# Patient Record
Sex: Female | Born: 1935 | Race: Black or African American | Hispanic: No | Marital: Single | Smoking: Never smoker
Health system: Southern US, Community
[De-identification: ages and names within clinical notes are randomized; demographics above are authoritative.]

## PROBLEM LIST (undated history)

## (undated) DIAGNOSIS — I10 Essential (primary) hypertension: Secondary | ICD-10-CM

## (undated) HISTORY — PX: SMALL INTESTINE SURGERY: SHX150

---

## 2018-09-10 ENCOUNTER — Encounter (HOSPITAL_COMMUNITY): Payer: Self-pay

## 2018-09-10 ENCOUNTER — Emergency Department (HOSPITAL_COMMUNITY)
Admission: EM | Admit: 2018-09-10 | Discharge: 2018-09-10 | Disposition: A | Discharge status: Home / Self Care | Payer: Medicare Other | Attending: Emergency Medicine | Admitting: Emergency Medicine

## 2018-09-10 ENCOUNTER — Other Ambulatory Visit: Payer: Self-pay

## 2018-09-10 ENCOUNTER — Emergency Department (HOSPITAL_COMMUNITY): Payer: Medicare Other

## 2018-09-10 DIAGNOSIS — Z79899 Other long term (current) drug therapy: Secondary | ICD-10-CM | POA: Insufficient documentation

## 2018-09-10 DIAGNOSIS — R110 Nausea: Secondary | ICD-10-CM | POA: Diagnosis not present

## 2018-09-10 DIAGNOSIS — R1013 Epigastric pain: Secondary | ICD-10-CM | POA: Diagnosis not present

## 2018-09-10 LAB — URINALYSIS, ROUTINE W REFLEX MICROSCOPIC
BILIRUBIN URINE: NEGATIVE
Bacteria, UA: NONE SEEN
Glucose, UA: NEGATIVE mg/dL
HGB URINE DIPSTICK: NEGATIVE
Ketones, ur: 5 mg/dL — AB
Nitrite: NEGATIVE
Protein, ur: NEGATIVE mg/dL
pH: 8 (ref 5.0–8.0)

## 2018-09-10 LAB — COMPREHENSIVE METABOLIC PANEL
ALT: 15 U/L (ref 0–44)
ANION GAP: 10 (ref 5–15)
AST: 26 U/L (ref 15–41)
Albumin: 4.1 g/dL (ref 3.5–5.0)
Alkaline Phosphatase: 62 U/L (ref 38–126)
BUN: 22 mg/dL (ref 8–23)
CO2: 29 mmol/L (ref 22–32)
Calcium: 9.4 mg/dL (ref 8.9–10.3)
Chloride: 101 mmol/L (ref 98–111)
Creatinine, Ser: 1.34 mg/dL — ABNORMAL HIGH (ref 0.44–1.00)
GFR calc Af Amer: 39 mL/min — ABNORMAL LOW (ref >60)
GFR calc non Af Amer: 34 mL/min — ABNORMAL LOW (ref >60)
Glucose, Bld: 95 mg/dL (ref 70–99)
POTASSIUM: 4 mmol/L (ref 3.5–5.1)
Sodium: 140 mmol/L (ref 135–145)
Total Bilirubin: 0.5 mg/dL (ref 0.3–1.2)
Total Protein: 7.7 g/dL (ref 6.5–8.1)

## 2018-09-10 LAB — CBC
HCT: 35.6 % — ABNORMAL LOW (ref 36.0–46.0)
Hemoglobin: 11.5 g/dL — ABNORMAL LOW (ref 12.0–15.0)
MCH: 28.5 pg (ref 26.0–34.0)
MCHC: 32.3 g/dL (ref 30.0–36.0)
MCV: 88.3 fL (ref 80.0–100.0)
Platelets: 260 10*3/uL (ref 150–400)
RBC: 4.03 MIL/uL (ref 3.87–5.11)
RDW: 12.6 % (ref 11.5–15.5)
WBC: 8.8 10*3/uL (ref 4.0–10.5)
nRBC: 0 % (ref 0.0–0.2)

## 2018-09-10 LAB — LIPASE, BLOOD: Lipase: 72 U/L — ABNORMAL HIGH (ref 11–51)

## 2018-09-10 LAB — LACTIC ACID, PLASMA: LACTIC ACID, VENOUS: 1.3 mmol/L (ref 0.5–1.9)

## 2018-09-10 MED ORDER — MORPHINE SULFATE (PF) 4 MG/ML IV SOLN
4.0000 mg | Freq: Once | INTRAVENOUS | Status: AC
  Administered 2018-09-10: 4 mg via INTRAVENOUS
  Filled 2018-09-10: qty 1

## 2018-09-10 MED ORDER — PANTOPRAZOLE SODIUM 40 MG PO TBEC: 40.0000 mg | DELAYED_RELEASE_TABLET | Freq: Every day | ORAL | 0 refills | Status: AC

## 2018-09-10 MED ORDER — IOHEXOL 300 MG/ML  SOLN
100.0000 mL | Freq: Once | INTRAMUSCULAR | Status: AC | PRN
  Administered 2018-09-10: 80 mL via INTRAVENOUS

## 2018-09-10 MED ORDER — HYDROCODONE-ACETAMINOPHEN 5-325 MG PO TABS: 1.0000 | ORAL_TABLET | ORAL | 0 refills | Status: AC | PRN

## 2018-09-10 MED ORDER — PANTOPRAZOLE SODIUM 40 MG IV SOLR
40.0000 mg | Freq: Once | INTRAVENOUS | Status: AC
  Administered 2018-09-10: 40 mg via INTRAVENOUS
  Filled 2018-09-10: qty 40

## 2018-09-10 MED ORDER — SODIUM CHLORIDE 0.9 % IV BOLUS
1000.0000 mL | Freq: Once | INTRAVENOUS | Status: AC
  Administered 2018-09-10: 1000 mL via INTRAVENOUS

## 2018-09-10 NOTE — Discharge Instructions (Signed)
Continue Pepcid and start Protonix for abdominal pain Take Tylenol for mild-moderate pain. Take Norco for severe abdominal pain Please follow up with GI Return if worsening

## 2018-09-10 NOTE — ED Provider Notes (Signed)
[facility] Provider Note   CSN: [id number] Arrival date & time: 09/10/18  1029     History   Chief Complaint Chief Complaint  Patient presents with  . Abdominal Pain    HPI Dominique Burke is a 85 y.o. female who presents with abdominal pain. PMH significant for osteopenia, gastritis, hypertension, hyperlipidemia.  [place] and is visiting her daughter for the next 3 weeks.  She has been having intermittent epigastric abdominal pain since Thanksgiving.  The pain is worse with eating.  Nothing has made the pain better.  She has been seen in the emergency department several times and was diagnosed with gastritis.  It sounds like she had a CT of her abdomen and right upper quadrant ultrasound which were normal.  She was started on Pepcid which she has been taking without significant relief.  The pain was worse today and she has had a couple episodes of vomiting therefore her daughter wanted to bring her to the emergency department here.  The vomiting is food contents.  She had a bowel movement this morning and is passing gas.  She denies fever, chills, chest pain, shortness of breath, urinary symptoms, blood in the stool.  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  She has never had an endoscopy that she knows of.  She has not seen GI for this problem.  Her daughter is concerned because she is lost weight and thinks she may be dehydrated.  HPI  History reviewed. No pertinent past medical history.  There are no active problems to display for this patient.   History reviewed. No pertinent surgical history.   OB History   No obstetric history on file.      Home Medications    Prior to Admission medications   Medication Sig Start Date End Date Taking? Authorizing  Provider  alendronate (FOSAMAX) 35 MG tablet Take 35 mg by mouth every Saturday. Take with a full glass of water on an empty stomach.   Yes [provider]  dorzolamide (TRUSOPT) 2 % ophthalmic solution Place 1 drop into both eyes 3 (three) times daily.   Yes [provider]  famotidine (PEPCID) 20 MG tablet Take 20 mg by mouth daily.   Yes [provider]  irbesartan-hydrochlorothiazide (AVALIDE) 300-12.5 MG tablet Take 1 tablet by mouth daily.   Yes [provider]  potassium chloride SA (K-DUR,KLOR-CON) 20 MEQ tablet Take 20 mEq by mouth daily.   Yes [provider]  rosuvastatin (CRESTOR) 10 MG tablet Take 10 mg by mouth daily.   Yes [provider]    Family History History reviewed. No pertinent family history.  Social History Social History   Tobacco Use  . Smoking status: Never Smoker  Substance Use Topics  . Alcohol use: Never    Frequency: Never  . Drug use: Never     Allergies   Patient has no known allergies.   Review of Systems Review of Systems  Constitutional: Positive for appetite change and unexpected weight change. Negative for chills and fever.  Respiratory: Negative for shortness of breath.   Cardiovascular: Negative for chest pain.  Gastrointestinal: Positive for abdominal pain, nausea and vomiting. Negative for blood in stool, constipation and diarrhea.  Genitourinary: Negative for difficulty urinating and dysuria.  All other systems  reviewed and are negative.    Physical Exam Updated Vital Signs BP (!) 141/60 (BP Location: Left Arm)   Pulse (!) 103   Temp 97.6 F (36.4 C) (Oral)   Ht 5\' 1"  (1.549 m)   Wt 68 kg   SpO2 95%   BMI 28.34 kg/m   Physical Exam Vitals signs and nursing note reviewed.  Constitutional:      General: She is not in acute distress.    Appearance: She is well-developed.  HENT:     Head: Normocephalic and atraumatic.  Eyes:     General: No scleral icterus.        Right eye: No discharge.        Left eye: No discharge.     Conjunctiva/sclera: Conjunctivae normal.     Pupils: Pupils are equal, round, and reactive to light.  Neck:     Musculoskeletal: Normal range of motion.  Cardiovascular:     Rate and Rhythm: Normal rate and regular rhythm.  Pulmonary:     Effort: Pulmonary effort is normal. No respiratory distress.     Breath sounds: Normal breath sounds.  Abdominal:     General: Abdomen is flat and protuberant. Bowel sounds are increased. There is no distension.     Palpations: Abdomen is soft.     Tenderness: There is abdominal tenderness in the epigastric area.  Skin:    General: Skin is warm and dry.  Neurological:     Mental Status: She is alert and oriented to person, place, and time.  Psychiatric:        Behavior: Behavior normal.      ED Treatments / Results  Labs (all labs ordered are listed, but only abnormal results are displayed) Labs Reviewed  LIPASE, BLOOD - Abnormal; Notable for the following components:      Result Value   Lipase 72 (*)    All other components within normal limits  COMPREHENSIVE METABOLIC PANEL - Abnormal; Notable for the following components:   Creatinine, Ser 1.34 (*)    GFR calc non Af Amer 34 (*)    GFR calc Af Amer 39 (*)    All other components within normal limits  CBC - Abnormal; Notable for the following components:   Hemoglobin 11.5 (*)    HCT 35.6 (*)    All other components within normal limits  LACTIC ACID, PLASMA  URINALYSIS, ROUTINE W REFLEX MICROSCOPIC    EKG EKG Interpretation  Date/Time:  Thursday September 10 2018 12:51:26 EST Ventricular Rate:  76 PR Interval:    QRS Duration: 123 QT Interval:  424 QTC Calculation: 477 R Axis:   -34 Text Interpretation:  Sinus rhythm Right bundle branch block Confirmed by Kennis CarinaBero, Michael [phone]([id number]) on 09/10/2018 1:24:52 PM   Radiology Ct Abdomen Pelvis W Contrast  Result Date: 09/10/2018 CLINICAL DATA:  Acute pancreatitis, first  episode, mid abdominal pain with nausea and vomiting for 3 days, unable to keep anything down, of unknown etiology EXAM: CT ABDOMEN AND PELVIS WITH CONTRAST TECHNIQUE: Multidetector CT imaging of the abdomen and pelvis was performed using the standard protocol following bolus administration of intravenous contrast. Sagittal and coronal MPR images reconstructed from axial data set. CONTRAST:  80mL OMNIPAQUE IOHEXOL 300 MG/ML SOLN IV. No oral contrast. COMPARISON:  None FINDINGS: Lower chest: Lung bases clear Hepatobiliary: Unremarkable gallbladder. Minimal central intrahepatic biliary prominence. CBD normal caliber. No focal hepatic mass lesion. Pancreas: Small linear calcification adjacent to the body the pancreas anteriorly image 24 likely vascular.  Pancreatic duct 3 mm diameter, upper normal. Remainder of pancreas normal appearance. No peripancreatic infiltrative changes or fluid collections identified. Spleen: Normal appearance Adrenals/Urinary Tract: Small RIGHT renal cysts. Kidneys and proximal ureters normal appearance. Distal ureters and bladder obscured by beam hardening artifacts in pelvis. Stomach/Bowel: Stomach underdistended. Questionable wall thickening of the distal gastric antrum versus artifact from underdistention. Bowel anastomosis in the mid abdomen/upper pelvis LEFT of midline, appears ileocolic, favor prior RIGHT hemicolectomy. No RIGHT colon or appendix visualized. Multiple dilated loops of small bowel are seen in the upper abdomen up to 5 cm transverse. A segment of small bowel in the mid pelvis with increased enhancement and suspected wall thickening identified question enteritis. Proximal to this site small-bowel appears dilated with decompression distally. Findings are most consistent with a mid small bowel obstruction. No additional wall thickening or evidence of perforation. Vascular/Lymphatic: Atherosclerotic calcifications aorta and iliac arteries without aneurysm. Scattered vessel  arterial calcification. No adenopathy. Reproductive: Pelvic calcifications likely representing calcified leiomyomata. Majority of uterus obscured. Ovaries unremarkable. Other: No free air or free fluid.  No hernia. Musculoskeletal: Bones diffusely demineralized with prior lumbar fusion L2-L5. Scattered degenerative disc disease changes lower thoracic and upper lumbar spine. BILATERAL hip prostheses. IMPRESSION: Dilated proximal and decompressed distal small bowel loops with an intervening segment of thickened small bowel demonstrating hyperenhancement, question enteritis; this could be related to infection or inflammatory bowel disease, tumor considered unlikely due to length of involvement. This appears to be at least partially obstructing the small bowel which is dilated proximally. No CT evidence of acute pancreatitis. Electronically Signed   By: Ulyses Southward M.D.   On: 09/10/2018 14:27    Procedures Procedures (including critical care time)  Medications Ordered in ED Medications  morphine 4 MG/ML injection 4 mg (4 mg Intravenous Given 09/10/18 1301)  sodium chloride 0.9 % bolus 1,000 mL (1,000 mLs Intravenous New Bag/Given 09/10/18 1301)  pantoprazole (PROTONIX) injection 40 mg (40 mg Intravenous Given 09/10/18 1301)  iohexol (OMNIPAQUE) 300 MG/ML solution 100 mL (80 mLs Intravenous Contrast Given 09/10/18 1407)     Initial Impression / Assessment and Plan / ED Course  I have reviewed the triage vital signs and the nursing notes.  Pertinent labs & imaging results that were available during my care of the patient were reviewed by me and considered in my medical decision making (see chart for details).  85 year old female presents with epigastric abdominal pain and vomiting. This is a recurrent problem for her. On exam she is mildly tachycardic and appears uncomfortable. She is tender in the epigastric area. CBC is remarkable for mild anemia. CMP is remarkable for mild AKI (1.34). Lipase is slightly  elevated to 72. Lactic acid is normal. UA is pending. Will give fluids, pain control, PPI and obtain CT abdomen/pelvis.  CT shows dilated proximal and decompressed distal small bowel loops. DDx includes small bowel obstruction, enteritis, IBD, malignancy. Discussed with pt and daughter. She feels much better after medicines. She has moved her bowels today. Will PO challenge and reassess.  PO challenge was tolerated well. Pain is controlled and she is without further vomiting. Will d/c with rx for Protonix and Norco. They were give f/u with GI.  Final Clinical Impressions(s) / ED Diagnoses   Final diagnoses:  Epigastric abdominal pain  Nausea    ED Discharge Orders    None       Bethel Born, PA-C 09/10/18 1708    Sabas Sous, MD 09/11/18 [id number]

## 2018-09-10 NOTE — ED Triage Notes (Signed)
Pt endorses mid abd pain with n/v x 3 days, unable to keep anything down. Axox4. Denies urinary sx.

## 2020-07-21 IMAGING — CT CT ABD-PELV W/ CM
2 of 5 series · 15 of 46 positions shown, 17 images · IV contrast (omnipaque)
Comparison: None

CLINICAL DATA: Acute pancreatitis, first episode, mid abdominal
pain with nausea and vomiting for 3 days, unable to keep anything
down, of unknown etiology

EXAM:
CT ABDOMEN AND PELVIS WITH CONTRAST
TECHNIQUE: Multidetector CT imaging of the abdomen and pelvis was performed
using the standard protocol following bolus administration of
intravenous contrast. Sagittal and coronal MPR images reconstructed
from axial data set.
CONTRAST:  80mL OMNIPAQUE IOHEXOL 300 MG/ML SOLN IV. No oral
contrast.

[Series 3: a/p w/ 5mm · axial · 0.69mm/px · z∈[-288,+72]mm · 12 of 82 slices shown, 14 images]
[im 5/82  soft-tissue]
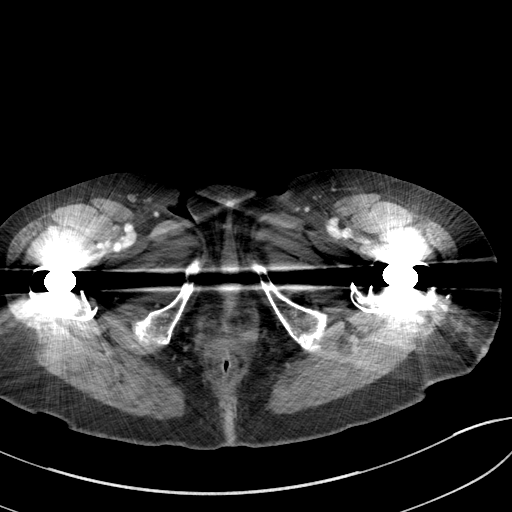
[im 5/82  bone]
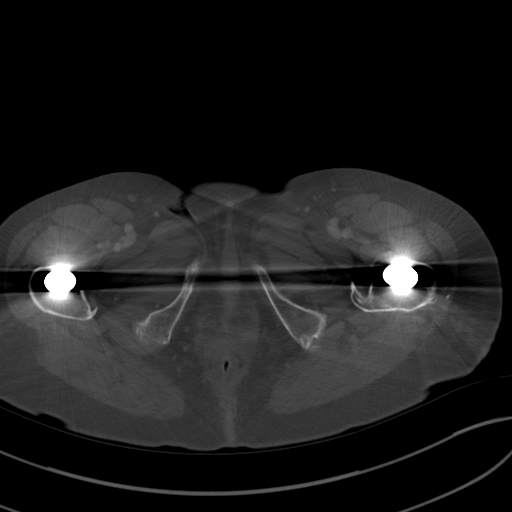
[im 13/82  soft-tissue]
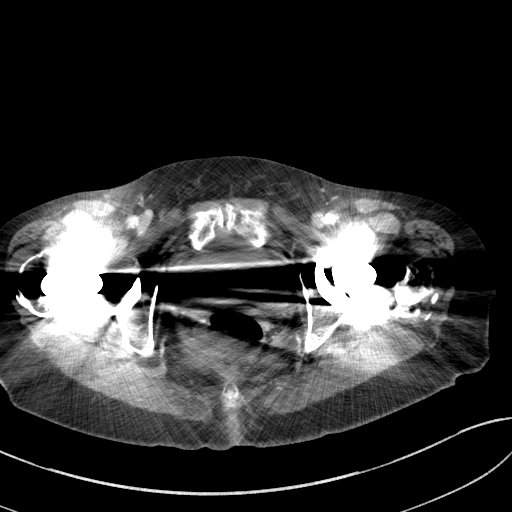
[im 17/82  soft-tissue]
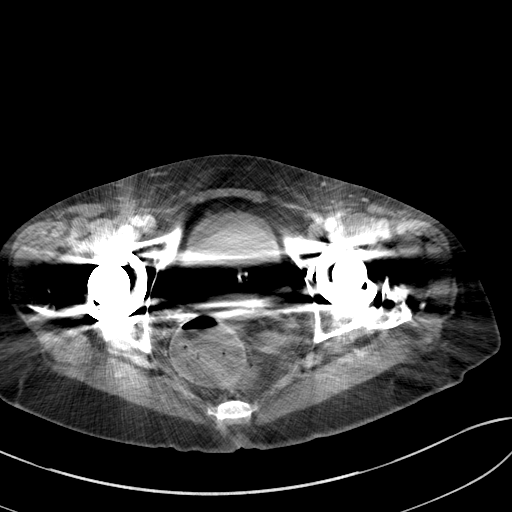
[im 25/82  soft-tissue]
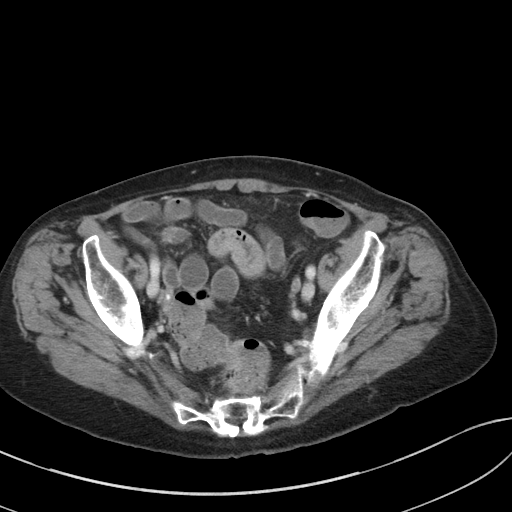
[im 33/82  soft-tissue]
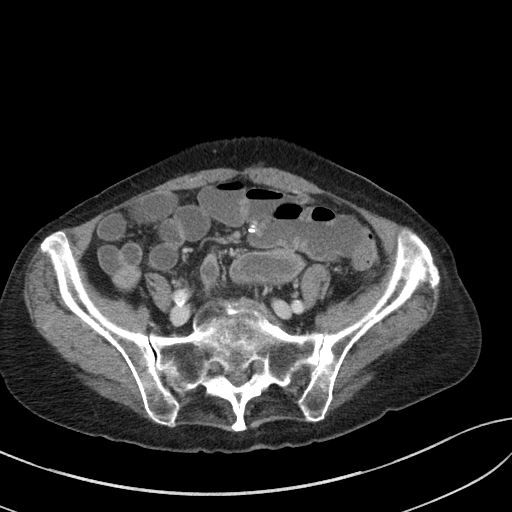
[im 37/82  soft-tissue]
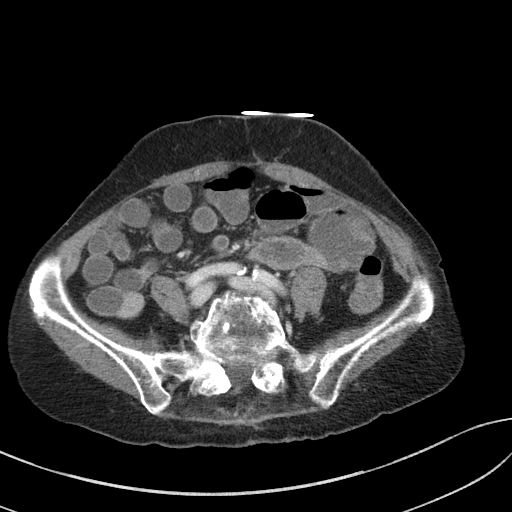
[im 45/82  soft-tissue]
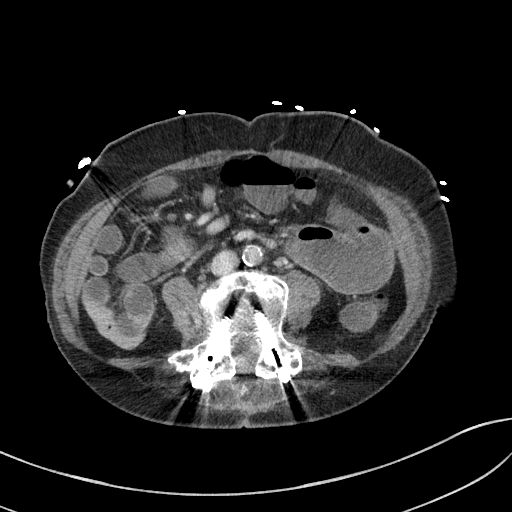
[im 49/82  soft-tissue]
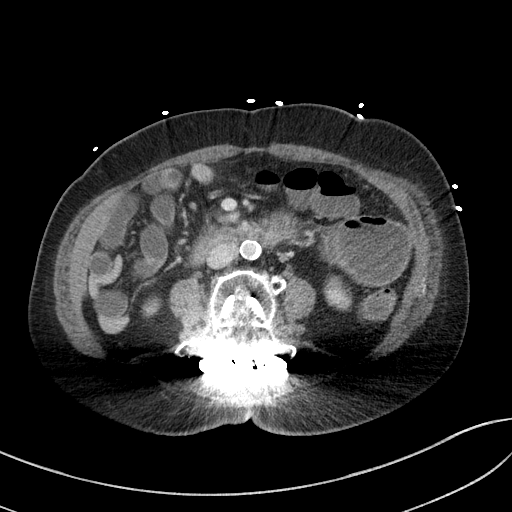
[im 57/82  soft-tissue]
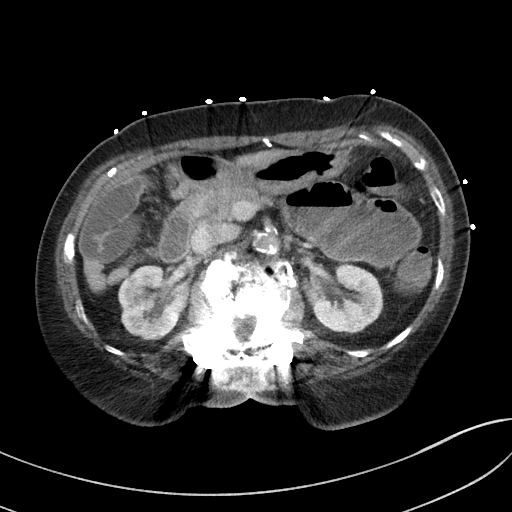
[im 57/82  bone]
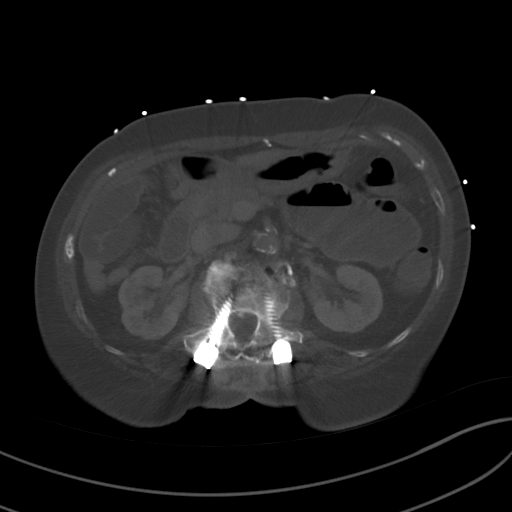
[im 65/82  soft-tissue]
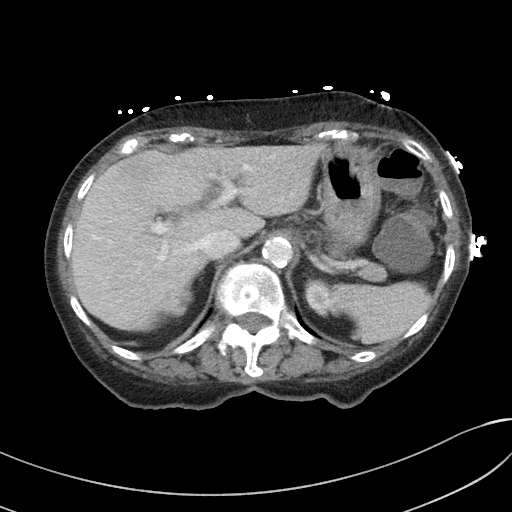
[im 69/82  soft-tissue]
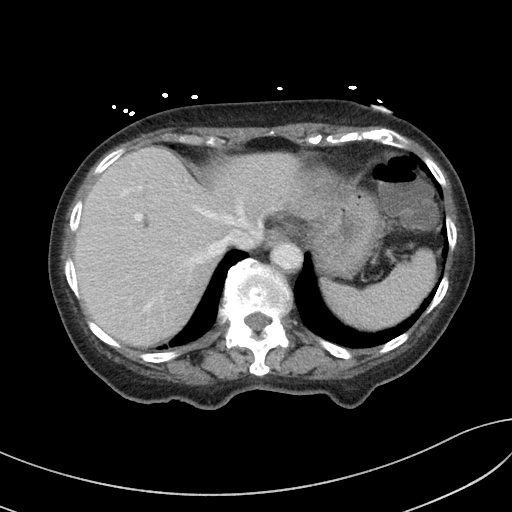
[im 77/82  soft-tissue]
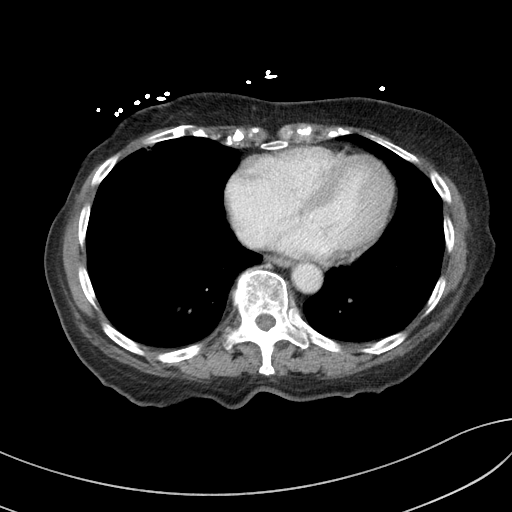

[Series 6: a/p w/ cor · coronal · 0.62mm/px · 3 of 117 slices shown]
[im 39/117  soft-tissue]
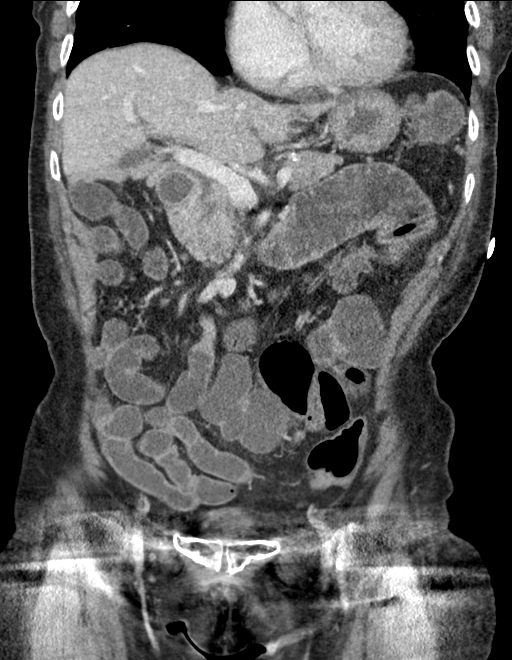
[im 52/117  soft-tissue]
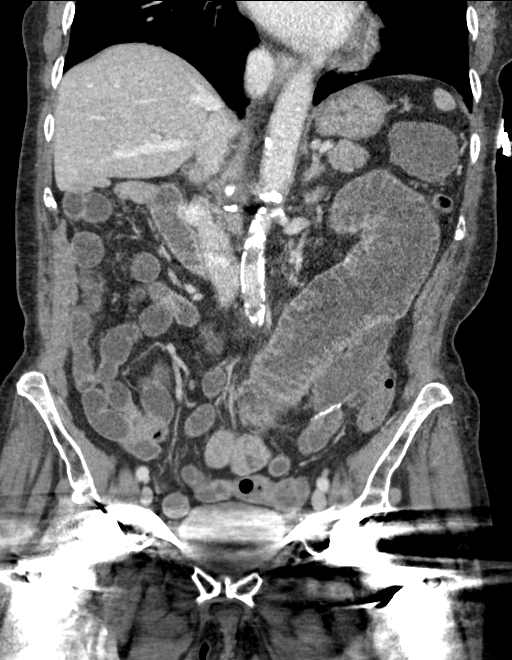
[im 65/117  soft-tissue]
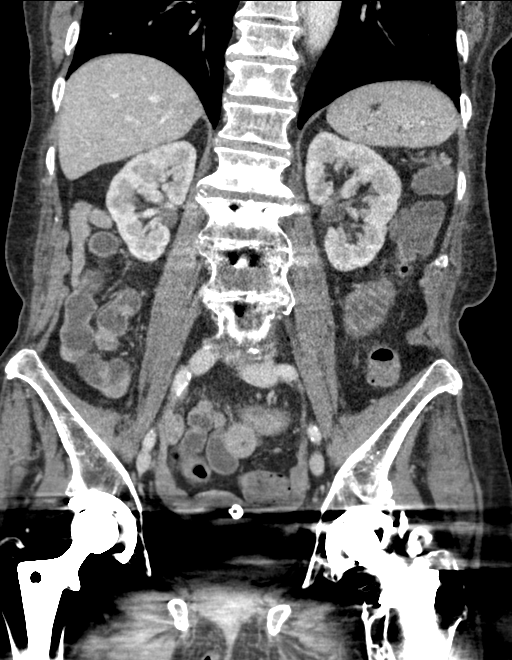

[15 of 46 positions shown; findings below may reference images not displayed]

FINDINGS: Lower chest: Lung bases clear

Hepatobiliary: Unremarkable gallbladder. Minimal central
intrahepatic biliary prominence. CBD normal caliber. No focal
hepatic mass lesion.

Pancreas: Small linear calcification adjacent to the body the
pancreas anteriorly image 24 likely vascular. Pancreatic duct 3 mm
diameter, upper normal. Remainder of pancreas normal appearance. No
peripancreatic infiltrative changes or fluid collections identified.

Spleen: Normal appearance

Adrenals/Urinary Tract: Small RIGHT renal cysts. Kidneys and
proximal ureters normal appearance. Distal ureters and bladder
obscured by beam hardening artifacts in pelvis.

Stomach/Bowel: Stomach underdistended. Questionable wall thickening
of the distal gastric antrum versus artifact from underdistention.
Bowel anastomosis in the mid abdomen/upper pelvis LEFT of midline,
appears ileocolic, favor prior RIGHT hemicolectomy. No RIGHT colon
or appendix visualized. Multiple dilated loops of small bowel are
seen in the upper abdomen up to 5 cm transverse. A segment of small
bowel in the mid pelvis with increased enhancement and suspected
wall thickening identified question enteritis. Proximal to this site
small-bowel appears dilated with decompression distally. Findings
are most consistent with a mid small bowel obstruction. No
additional wall thickening or evidence of perforation.

Vascular/Lymphatic: Atherosclerotic calcifications aorta and iliac
arteries without aneurysm. Scattered vessel arterial calcification.
No adenopathy.

Reproductive: Pelvic calcifications likely representing calcified
leiomyomata. Majority of uterus obscured. Ovaries unremarkable.

Other: No free air or free fluid.  No hernia.

Musculoskeletal: Bones diffusely demineralized with prior lumbar
fusion L2-L5. Scattered degenerative disc disease changes lower
thoracic and upper lumbar spine. BILATERAL hip prostheses.
IMPRESSION: Dilated proximal and decompressed distal small bowel loops with an
intervening segment of thickened small bowel demonstrating
hyperenhancement, question enteritis; this could be related to
infection or inflammatory bowel disease, tumor considered unlikely
due to length of involvement.

This appears to be at least partially obstructing the small bowel
which is dilated proximally.

No CT evidence of acute pancreatitis.

## 2023-02-14 ENCOUNTER — Emergency Department (HOSPITAL_COMMUNITY)
Admission: EM | Admit: 2023-02-14 | Discharge: 2023-02-14 | Disposition: A | Discharge status: Home / Self Care | Payer: Medicare Other | Attending: Emergency Medicine | Admitting: Emergency Medicine

## 2023-02-14 ENCOUNTER — Emergency Department (HOSPITAL_COMMUNITY): Payer: Medicare Other

## 2023-02-14 ENCOUNTER — Encounter (HOSPITAL_COMMUNITY): Payer: Self-pay | Admitting: *Deleted

## 2023-02-14 ENCOUNTER — Other Ambulatory Visit: Payer: Self-pay

## 2023-02-14 DIAGNOSIS — R0789 Other chest pain: Secondary | ICD-10-CM | POA: Diagnosis not present

## 2023-02-14 DIAGNOSIS — I10 Essential (primary) hypertension: Secondary | ICD-10-CM | POA: Diagnosis not present

## 2023-02-14 DIAGNOSIS — Z79899 Other long term (current) drug therapy: Secondary | ICD-10-CM | POA: Diagnosis not present

## 2023-02-14 DIAGNOSIS — R079 Chest pain, unspecified: Secondary | ICD-10-CM | POA: Diagnosis present

## 2023-02-14 HISTORY — DX: Essential (primary) hypertension: I10

## 2023-02-14 LAB — URINALYSIS, ROUTINE W REFLEX MICROSCOPIC
Bilirubin Urine: NEGATIVE
Glucose, UA: NEGATIVE mg/dL
Hgb urine dipstick: NEGATIVE
Ketones, ur: NEGATIVE mg/dL
Leukocytes,Ua: NEGATIVE
Nitrite: NEGATIVE
Protein, ur: NEGATIVE mg/dL
Specific Gravity, Urine: 1.011 (ref 1.005–1.030)
pH: 6 (ref 5.0–8.0)

## 2023-02-14 LAB — CBC
HCT: 33.5 % — ABNORMAL LOW (ref 36.0–46.0)
Hemoglobin: 11.1 g/dL — ABNORMAL LOW (ref 12.0–15.0)
MCH: 28.5 pg (ref 26.0–34.0)
MCHC: 33.1 g/dL (ref 30.0–36.0)
MCV: 86.1 fL (ref 80.0–100.0)
Platelets: 208 10*3/uL (ref 150–400)
RBC: 3.89 MIL/uL (ref 3.87–5.11)
RDW: 12.7 % (ref 11.5–15.5)
WBC: 5.6 10*3/uL (ref 4.0–10.5)
nRBC: 0 % (ref 0.0–0.2)

## 2023-02-14 LAB — COMPREHENSIVE METABOLIC PANEL
ALT: 15 U/L (ref 0–44)
AST: 26 U/L (ref 15–41)
Albumin: 3.8 g/dL (ref 3.5–5.0)
Alkaline Phosphatase: 75 U/L (ref 38–126)
Anion gap: 9 (ref 5–15)
BUN: 21 mg/dL (ref 8–23)
CO2: 21 mmol/L — ABNORMAL LOW (ref 22–32)
Calcium: 8.6 mg/dL — ABNORMAL LOW (ref 8.9–10.3)
Chloride: 105 mmol/L (ref 98–111)
Creatinine, Ser: 1.03 mg/dL — ABNORMAL HIGH (ref 0.44–1.00)
GFR, Estimated: 49 mL/min — ABNORMAL LOW (ref >60)
Glucose, Bld: 116 mg/dL — ABNORMAL HIGH (ref 70–99)
Potassium: 3.9 mmol/L (ref 3.5–5.1)
Sodium: 135 mmol/L (ref 135–145)
Total Bilirubin: 0.8 mg/dL (ref 0.3–1.2)
Total Protein: 6.9 g/dL (ref 6.5–8.1)

## 2023-02-14 LAB — LIPASE, BLOOD: Lipase: 53 U/L — ABNORMAL HIGH (ref 11–51)

## 2023-02-14 MED ORDER — METHOCARBAMOL 500 MG PO TABS: 250.0000 mg | ORAL_TABLET | Freq: Three times a day (TID) | ORAL | 0 refills | Status: AC | PRN

## 2023-02-14 NOTE — ED Provider Notes (Signed)
[facility]ERG[facility]Y DEPARTMENT AT [facility] Provider Note   CSN: [id number] Arrival date & time: 02/14/23  1325     History  Chief Complaint  Patient presents with   Abdominal Pain    Dominique Burke is a 89 y.o. female.   Abdominal Pain Patient presents with right-sided chest pain.  Worse with movement.  Has had for the last weeks.  States just came to town however family wants her to be seen.  Worse with certain movements.  No nausea or vomiting.  No dysuria.  Potentially had been going to the bathroom more often.    Past Medical History:  Diagnosis Date   Hypertension     Home Medications Prior to Admission medications   Medication Sig Start Date End Date Taking? Authorizing Provider  methocarbamol (ROBAXIN) 500 MG tablet Take 0.5-1 tablets (250-500 mg total) by mouth every 8 (eight) hours as needed for muscle spasms. 02/14/23  Yes Benjiman Core, MD  alendronate (FOSAMAX) 35 MG tablet Take 35 mg by mouth every Saturday. Take with a full glass of water on an empty stomach.    [provider]  dorzolamide (TRUSOPT) 2 % ophthalmic solution Place 1 drop into both eyes 3 (three) times daily.    [provider]  famotidine (PEPCID) 20 MG tablet Take 20 mg by mouth daily.    [provider]  HYDROcodone-acetaminophen (NORCO/VICODIN) 5-325 MG tablet Take 1 tablet by mouth every 4 (four) hours as needed. 09/10/18   Bethel Born, PA-C  irbesartan-hydrochlorothiazide (AVALIDE) 300-12.5 MG tablet Take 1 tablet by mouth daily.    [provider]  pantoprazole (PROTONIX) 40 MG tablet Take 1 tablet (40 mg total) by mouth daily. 09/10/18   Bethel Born, PA-C  potassium chloride SA (K-DUR,KLOR-CON) 20 MEQ tablet Take 20 mEq by mouth daily.    [provider]  rosuvastatin (CRESTOR) 10 MG tablet Take 10 mg by mouth daily.    [provider]      Allergies    Patient has no known allergies.    Review of Systems    Review of Systems  Gastrointestinal:  Positive for abdominal pain.    Physical Exam Updated Vital Signs BP (!) 167/58   Pulse 85   Temp 98 F (36.7 C) (Oral)   Resp 18   Ht 5\' 6"  (1.676 m)   Wt 54.9 kg   SpO2 100%   BMI 19.53 kg/m  Physical Exam Vitals and nursing note reviewed.  Pulmonary:     Comments: Tenderness right lateral chest wall.  No rash.  Pain with movement.  Lungs clear. Abdominal:     Tenderness: There is no abdominal tenderness.  Skin:    General: Skin is warm.  Neurological:     Mental Status: She is alert.     ED Results / Procedures / Treatments   Labs (all labs ordered are listed, but only abnormal results are displayed) Labs Reviewed  LIPASE, BLOOD - Abnormal; Notable for the following components:      Result Value   Lipase 53 (*)    All other components within normal limits  COMPREHENSIVE METABOLIC PANEL - Abnormal; Notable for the following components:   CO2 21 (*)    Glucose, Bld 116 (*)    Creatinine, Ser 1.03 (*)    Calcium 8.6 (*)    GFR, Estimated 49 (*)    All other components within normal limits  CBC - Abnormal; Notable for the following components:  Hemoglobin 11.1 (*)    HCT 33.5 (*)    All other components within normal limits  URINALYSIS, ROUTINE W REFLEX MICROSCOPIC - Abnormal; Notable for the following components:   APPearance HAZY (*)    All other components within normal limits    EKG None  Radiology DG Chest 2 View  Result Date: 02/14/2023 CLINICAL DATA:  chest pain EXAM: CHEST - 2 VIEW COMPARISON:  None Available. FINDINGS: The heart size and mediastinal contours are within normal limits. No pleural effusion. No pneumothorax. Left basilar atelectasis. The visualized skeletal structures are unremarkable. Partially visualized lumbar spinal fusion hardware in place IMPRESSION: Left basilar atelectasis. Electronically Signed   By: Lorenza Cambridge M.D.   On: 02/14/2023 14:53    Procedures Procedures    Medications  Ordered in ED Medications - No data to display  ED Course/ Medical Decision Making/ A&P                             Medical Decision Making Amount and/or Complexity of Data Reviewed Labs: ordered. Radiology: ordered.  Risk Prescription drug management.   Patient with right chest pain.  Worse with certain movements.  Differential diagnose includes pneumonia, lung mass, chest wall pain.  Intra-abdominal pathology felt less likely but does somewhat go into what could be abdominal.  No abdominal tenderness.  Lab work reassuring.  Lipase slightly elevated.  Hemoglobin mildly decreased but appears to be at baseline as of a few years ago.  I think this is most likely a chest wall pain less likely considered something such as AAA.  Treat with muscle relaxers and discharged home.        Final Clinical Impression(s) / ED Diagnoses Final diagnoses:  Chest wall pain    Rx / DC Orders ED Discharge Orders          Ordered    methocarbamol (ROBAXIN) 500 MG tablet  Every 8 hours PRN        02/14/23 1531              Benjiman Core, MD 02/14/23 1536

## 2023-02-14 NOTE — ED Triage Notes (Signed)
Patient is alert oriented  c/o right side pain , worse with movement, states its been hurting her for several weeks no change today other than she just told her family about to. They states they recently came to [place] on a train and they noticed she was going to the bathroom more often.
